# Patient Record
Sex: Female | Born: 1960 | Race: Black or African American | Hispanic: No | Marital: Single | Smoking: Never smoker
Health system: Southern US, Community
[De-identification: ages and names within clinical notes are randomized; demographics above are authoritative.]

## PROBLEM LIST (undated history)

## (undated) DIAGNOSIS — E785 Hyperlipidemia, unspecified: Secondary | ICD-10-CM

## (undated) HISTORY — PX: ABDOMINAL HYSTERECTOMY: SHX81

---

## 2013-04-04 ENCOUNTER — Emergency Department (HOSPITAL_BASED_OUTPATIENT_CLINIC_OR_DEPARTMENT_OTHER)
Admission: EM | Admit: 2013-04-04 | Discharge: 2013-04-04 | Disposition: A | Discharge status: Home / Self Care | Payer: BC Managed Care – PPO | Attending: Emergency Medicine | Admitting: Emergency Medicine

## 2013-04-04 ENCOUNTER — Encounter (HOSPITAL_BASED_OUTPATIENT_CLINIC_OR_DEPARTMENT_OTHER): Payer: Self-pay | Admitting: Emergency Medicine

## 2013-04-04 DIAGNOSIS — M542 Cervicalgia: Secondary | ICD-10-CM | POA: Insufficient documentation

## 2013-04-04 DIAGNOSIS — E785 Hyperlipidemia, unspecified: Secondary | ICD-10-CM | POA: Insufficient documentation

## 2013-04-04 DIAGNOSIS — M79609 Pain in unspecified limb: Secondary | ICD-10-CM | POA: Insufficient documentation

## 2013-04-04 DIAGNOSIS — M79602 Pain in left arm: Secondary | ICD-10-CM

## 2013-04-04 DIAGNOSIS — Z79899 Other long term (current) drug therapy: Secondary | ICD-10-CM | POA: Insufficient documentation

## 2013-04-04 HISTORY — DX: Hyperlipidemia, unspecified: E78.5

## 2013-04-04 MED ORDER — HYDROCODONE-ACETAMINOPHEN 5-325 MG PO TABS: 2.0000 | ORAL_TABLET | ORAL | Status: DC | PRN

## 2013-04-04 MED ORDER — IBUPROFEN 800 MG PO TABS: 800.0000 mg | ORAL_TABLET | Freq: Three times a day (TID) | ORAL | Status: AC

## 2013-04-04 NOTE — ED Provider Notes (Signed)
Medical screening examination/treatment/procedure(s) were performed by non-physician practitioner and as supervising physician I was immediately available for consultation/collaboration.  EKG Interpretation    Date/Time:  Tuesday April 04 2013 17:30:40 EST Ventricular Rate:  69 PR Interval:  168 QRS Duration: 76 QT Interval:  388 QTC Calculation: 415 R Axis:   26 Text Interpretation:  Normal sinus rhythm Normal ECG No previous tracing Confirmed by Jerica Creegan  MD-J, Sheryl Saintil ([id number]) on 04/04/2013 5:39:25 PM              Celene KrasJon R Shirel Mallis, MD 04/04/13 [phone]

## 2013-04-04 NOTE — ED Provider Notes (Signed)
CSN: [id number]     Arrival date & time 04/04/13  1646 History   First MD Initiated Contact with Patient 04/04/13 1710     Chief Complaint  Patient presents with  . Arm Pain   (Consider location/radiation/quality/duration/timing/severity/associated sxs/prior Treatment) Patient is a 53 y.o. female presenting with arm pain. The history is provided by the patient. No language interpreter was used.  Arm Pain This is a new problem. The current episode started in the past 7 days. The problem occurs constantly. The problem has been unchanged. Associated symptoms include myalgias and neck pain. Pertinent negatives include no numbness. Nothing aggravates the symptoms. She has tried nothing for the symptoms. The treatment provided no relief.  Pt complains of soreness in left arm.  No injury.  Pt called her Md and was told she needed to have an EKG to make sure pain was not her heart.  Pt denies any chest pain.  No shortness of breath.   Pt had pain and soreness in the back of her neck 3 days ago and has had arm soreness since.  Pt reports arm feels better if she holds.   Past Medical History  Diagnosis Date  . Hyperlipemia    Past Surgical History  Procedure Laterality Date  . Abdominal hysterectomy     History reviewed. No pertinent family history. History  Substance Use Topics  . Smoking status: Not on file  . Smokeless tobacco: Not on file  . Alcohol Use: No   OB History   Grav Para Term Preterm Abortions TAB SAB Ect Mult Living                 Review of Systems  Musculoskeletal: Positive for myalgias and neck pain.  Neurological: Negative for numbness.  All other systems reviewed and are negative.    Allergies  Review of patient's allergies indicates no known allergies.  Home Medications   Current Outpatient Rx  Name  Route  Sig  Dispense  Refill  . simvastatin (ZOCOR) 40 MG tablet   Oral   Take 40 mg by mouth daily.          BP 154/79  Pulse 75  Temp(Src) 98.7 F  (37.1 C) (Oral)  Resp 16  Ht 5\' 5"  (1.651 m)  Wt 20 lb (9.072 kg)  BMI 3.33 kg/m2  SpO2 99% Physical Exam  Nursing note and vitals reviewed. Constitutional: She is oriented to person, place, and time. She appears well-developed and well-nourished.  HENT:  Head: Normocephalic and atraumatic.  Right Ear: External ear normal.  Nose: Nose normal.  Mouth/Throat: Oropharynx is clear and moist.  Eyes: Conjunctivae and EOM are normal. Pupils are equal, round, and reactive to light.  Neck: Normal range of motion. Neck supple.  Cardiovascular: Normal rate and normal heart sounds.   Pulmonary/Chest: Effort normal and breath sounds normal.  Musculoskeletal: She exhibits no tenderness.  No deformities,  From,  Soreness trapezius  Neurological: She is alert and oriented to person, place, and time. She has normal reflexes.  Skin: Skin is warm.  Psychiatric: She has a normal mood and affect.    ED Course  Procedures (including critical care time) Labs Review Labs Reviewed - No data to display Imaging Review No results found.  EKG Interpretation    Date/Time:  Tuesday April 04 2013 17:30:40 EST Ventricular Rate:  69 PR Interval:  168 QRS Duration: 76 QT Interval:  388 QTC Calculation: 415 R Axis:   26 Text Interpretation:  Normal sinus  rhythm Normal ECG No previous tracing Confirmed by KNAPP  MD-J, JON ([id number]) on 04/04/2013 5:39:25 PM            MDM   1. Arm pain, left    Pain does not sound cardiac.  I suspect symptoms are radicular.   Pt given rx for ibuprofen and hydrocodone.  Pt advised to see her Md for recheck    Elson AreasLeslie K Micaiah Remillard, [place]PA-C 04/04/13 [id number]

## 2013-04-04 NOTE — ED Notes (Signed)
Pt c/o left arm pain x 2 days denies injury

## 2013-04-04 NOTE — Discharge Instructions (Signed)
Myalgia, Adult °Myalgia is the medical term for muscle pain. It is a symptom of many things. Nearly everyone at some time in their life has this. The most common cause for muscle pain is overuse or straining and more so when you are not in shape. Injuries and muscle bruises cause myalgias. Muscle pain without a history of injury can also be caused by a virus. It frequently comes along with the flu. Myalgia not caused by muscle strain can be present in a large number of infectious diseases. Some autoimmune diseases like lupus and fibromyalgia can cause muscle pain. Myalgia may be mild, or severe. °SYMPTOMS  °The symptoms of myalgia are simply muscle pain. Most of the time this is short lived and the pain goes away without treatment. °DIAGNOSIS  °Myalgia is diagnosed by your caregiver by taking your history. This means you tell him when the problems began, what they are, and what has been happening. If this has not been a long term problem, your caregiver may want to watch for a while to see what will happen. If it has been long term, they may want to do additional testing. °TREATMENT  °The treatment depends on what the underlying cause of the muscle pain is. Often anti-inflammatory medications will help. °HOME CARE INSTRUCTIONS °· If the pain in your muscles came from overuse, slow down your activities until the problems go away. °· Myalgia from overuse of a muscle can be treated with alternating hot and cold packs on the muscle affected or with cold for the first couple days. If either heat or cold seems to make things worse, stop their use. °· Apply ice to the sore area for 15-20 minutes, 03-04 times per day, while awake for the first 2 days of muscle soreness, or as directed. Put the ice in a plastic bag and place a towel between the bag of ice and your skin. °· Only take over-the-counter or prescription medicines for pain, discomfort, or fever as directed by your caregiver. °· Regular gentle exercise may help if  you are not active. °· Stretching before strenuous exercise can help lower the risk of myalgia. It is normal when beginning an exercise regimen to feel some muscle pain after exercising. Muscles that have not been used frequently will be sore at first. If the pain is extreme, this may mean injury to a muscle. °SEEK MEDICAL CARE IF: °· You have an increase in muscle pain that is not relieved with medication. °· You begin to run a temperature. °· You develop nausea and vomiting. °· You develop a stiff and painful neck. °· You develop a rash. °· You develop muscle pain after a tick bite. °· You have continued muscle pain while working out even after you are in good condition. °SEEK IMMEDIATE MEDICAL CARE IF: °Any of your problems are getting worse and medications are not helping. °MAKE SURE YOU:  °· Understand these instructions. °· Will watch your condition. °· Will get help right away if you are not doing well or get worse. °Document Released: 01/29/2006 Document Revised: 06/01/2011 Document Reviewed: 04/20/2006 °[employer]® Patient Information ©2014 [employer]. ° °

## 2013-04-04 NOTE — ED Notes (Signed)
No old EKG found  

## 2014-11-15 ENCOUNTER — Emergency Department (HOSPITAL_BASED_OUTPATIENT_CLINIC_OR_DEPARTMENT_OTHER)
Admission: EM | Admit: 2014-11-15 | Discharge: 2014-11-15 | Disposition: A | Discharge status: Home / Self Care | Payer: Worker's Compensation | Attending: Emergency Medicine | Admitting: Emergency Medicine

## 2014-11-15 ENCOUNTER — Emergency Department (HOSPITAL_BASED_OUTPATIENT_CLINIC_OR_DEPARTMENT_OTHER): Payer: Self-pay

## 2014-11-15 ENCOUNTER — Encounter (HOSPITAL_BASED_OUTPATIENT_CLINIC_OR_DEPARTMENT_OTHER): Payer: Self-pay

## 2014-11-15 DIAGNOSIS — Z79899 Other long term (current) drug therapy: Secondary | ICD-10-CM | POA: Insufficient documentation

## 2014-11-15 DIAGNOSIS — E785 Hyperlipidemia, unspecified: Secondary | ICD-10-CM | POA: Insufficient documentation

## 2014-11-15 DIAGNOSIS — S161XXA Strain of muscle, fascia and tendon at neck level, initial encounter: Secondary | ICD-10-CM | POA: Insufficient documentation

## 2014-11-15 DIAGNOSIS — Z791 Long term (current) use of non-steroidal anti-inflammatories (NSAID): Secondary | ICD-10-CM | POA: Insufficient documentation

## 2014-11-15 DIAGNOSIS — R52 Pain, unspecified: Secondary | ICD-10-CM

## 2014-11-15 DIAGNOSIS — Y9289 Other specified places as the place of occurrence of the external cause: Secondary | ICD-10-CM | POA: Insufficient documentation

## 2014-11-15 DIAGNOSIS — W228XXA Striking against or struck by other objects, initial encounter: Secondary | ICD-10-CM | POA: Insufficient documentation

## 2014-11-15 DIAGNOSIS — Y9389 Activity, other specified: Secondary | ICD-10-CM | POA: Insufficient documentation

## 2014-11-15 DIAGNOSIS — Y990 Civilian activity done for income or pay: Secondary | ICD-10-CM | POA: Insufficient documentation

## 2014-11-15 DIAGNOSIS — S39012A Strain of muscle, fascia and tendon of lower back, initial encounter: Secondary | ICD-10-CM | POA: Insufficient documentation

## 2014-11-15 MED ORDER — NAPROXEN 500 MG PO TABS: 500.0000 mg | ORAL_TABLET | Freq: Two times a day (BID) | ORAL | Status: AC
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
## 2014-11-15 NOTE — ED Notes (Signed)
MD at bedside. 

## 2014-11-15 NOTE — ED Notes (Signed)
Patient transported to X-ray 
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
